# Patient Record
Sex: Female | Born: 1995 | Race: White | Hispanic: No | Marital: Single
Health system: Midwestern US, Academic
[De-identification: ages and names within clinical notes are randomized; demographics above are authoritative.]

---

## 2018-12-12 ENCOUNTER — Encounter: Admit: 2018-12-12 | Discharge: 2018-12-12

## 2018-12-12 DIAGNOSIS — S3720XA Unspecified injury of bladder, initial encounter: ICD-10-CM

## 2018-12-12 LAB — ALCOHOL LEVEL: Lab: 99 mg/dL — ABNORMAL LOW (ref 8.5–10.6)

## 2018-12-12 LAB — PROTIME INR (PT): Lab: 1.1 M/UL (ref 0.8–1.2)

## 2018-12-12 LAB — BETA-HCG: Lab: <1 U/L (ref <5)

## 2018-12-12 LAB — CBC
Lab: 15 10*3/uL — ABNORMAL HIGH (ref 4.5–11.0)
Lab: 17 10*3/uL — ABNORMAL HIGH (ref 4.5–11.0)

## 2018-12-12 LAB — BASIC METABOLIC PANEL
Lab: 106 mg/dL — ABNORMAL HIGH (ref 70–100)
Lab: 116 MMOL/L — ABNORMAL HIGH (ref 98–110)
Lab: 145 MMOL/L (ref 137–147)
Lab: 22 MMOL/L (ref 21–30)
Lab: 3.9 MMOL/L (ref 3.5–5.1)
Lab: 7 % (ref 3–12)
Lab: >60 mL/min (ref >60)
Lab: 0.6 mg/dL — ABNORMAL LOW (ref 0.4–1.00)
Lab: 139 mg/dL — ABNORMAL HIGH (ref 70–100)
Lab: 141 MMOL/L (ref 137–147)
Lab: 4 mg/dL — ABNORMAL LOW (ref 7–25)
Lab: 7.6 mg/dL — ABNORMAL LOW (ref 8.5–10.6)
Lab: >60 mL/min (ref >60)
Lab: >60 mL/min (ref >60)

## 2018-12-12 LAB — IONIZED CALCIUM: Lab: 0.8 MMOL/L — ABNORMAL LOW (ref 1.0–1.3)

## 2018-12-12 LAB — PREGNANCY TEST-URINE
Lab: 1
Lab: NEGATIVE

## 2018-12-12 LAB — MAGNESIUM: Lab: 1.7 mg/dL (ref 1.6–2.6)

## 2018-12-12 LAB — PHOSPHORUS: Lab: 3.9 mg/dL (ref 2.0–4.5)

## 2018-12-12 LAB — CREATINE KINASE-CPK: Lab: 281 U/L — ABNORMAL HIGH (ref 21–215)

## 2018-12-12 LAB — LACTIC ACID (BG - RAPID LACTATE): Lab: 1.9 MMOL/L — ABNORMAL LOW (ref 0.5–2.0)

## 2018-12-12 LAB — PTT (APTT): Lab: 26 s (ref 24.0–36.5)

## 2018-12-12 LAB — LACTIC ACID(LACTATE): Lab: 1.6 MMOL/L (ref 0.5–2.0)

## 2018-12-12 MED ORDER — BISACODYL 10 MG RE SUPP
20 mg | Freq: Every day | RECTAL | 0 refills | Status: DC | PRN
Start: 2018-12-12 — End: 2018-12-14

## 2018-12-12 MED ORDER — FAMOTIDINE (PF) 20 MG/2 ML IV SOLN
20 mg | Freq: Two times a day (BID) | INTRAVENOUS | 0 refills | Status: DC
Start: 2018-12-12 — End: 2018-12-12

## 2018-12-12 MED ORDER — PATCH DOCUMENTATION - SCOPOLAMINE BASE 1 MG/72HR
1 | Freq: Two times a day (BID) | TRANSDERMAL | 0 refills | Status: DC
Start: 2018-12-12 — End: 2018-12-13

## 2018-12-12 MED ORDER — MONTELUKAST 10 MG PO TAB
10 mg | Freq: Every evening | ORAL | 0 refills | Status: DC
Start: 2018-12-12 — End: 2018-12-20
  Administered 2018-12-13 – 2018-12-20 (×8): 10 mg via ORAL

## 2018-12-12 MED ORDER — ONDANSETRON HCL (PF) 4 MG/2 ML IJ SOLN
4-8 mg | INTRAVENOUS | 0 refills | Status: DC | PRN
Start: 2018-12-12 — End: 2018-12-13

## 2018-12-12 MED ORDER — ONDANSETRON HCL (PF) 4 MG/2 ML IJ SOLN
INTRAVENOUS | 0 refills | Status: DC
Start: 2018-12-12 — End: 2018-12-12
  Administered 2018-12-12: 13:00:00 4 mg via INTRAVENOUS

## 2018-12-12 MED ORDER — PHENYLEPHRINE IV DRIP (STD CONC)
0 refills | Status: DC
Start: 2018-12-12 — End: 2018-12-12
  Administered 2018-12-12 (×2): .3 ug/kg/min via INTRAVENOUS

## 2018-12-12 MED ORDER — ALBUTEROL SULFATE 2.5 MG/0.5 ML IN NEBU
2.5 mg | RESPIRATORY_TRACT | 0 refills | Status: DC | PRN
Start: 2018-12-12 — End: 2018-12-12

## 2018-12-12 MED ORDER — OXYCODONE 5 MG PO TAB
5-10 mg | ORAL | 0 refills | Status: DC | PRN
Start: 2018-12-12 — End: 2018-12-13
  Administered 2018-12-12 – 2018-12-13 (×3): 5 mg via ORAL

## 2018-12-12 MED ORDER — POLYETHYLENE GLYCOL 3350 17 GRAM PO PWPK
1 | Freq: Every day | ORAL | 0 refills | Status: DC
Start: 2018-12-12 — End: 2018-12-17
  Administered 2018-12-13 – 2018-12-17 (×5): 17 g via ORAL

## 2018-12-12 MED ORDER — FENTANYL CITRATE (PF) 50 MCG/ML IJ SOLN
0 refills | Status: DC
Start: 2018-12-12 — End: 2018-12-12
  Administered 2018-12-12 (×2): 50 ug via INTRAVENOUS

## 2018-12-12 MED ORDER — FENTANYL CITRATE (PF) 50 MCG/ML IJ SOLN
25 ug | Freq: Once | INTRAVENOUS | 0 refills | Status: CP
Start: 2018-12-12 — End: ?
  Administered 2018-12-12: 12:00:00 25 ug via INTRAVENOUS

## 2018-12-12 MED ORDER — SODIUM CHLORIDE 0.9 % IJ SOLN
50 mL | Freq: Once | INTRAVENOUS | 0 refills | Status: CP
Start: 2018-12-12 — End: ?
  Administered 2018-12-12: 11:00:00 50 mL via INTRAVENOUS

## 2018-12-12 MED ORDER — DEXAMETHASONE SODIUM PHOSPHATE 4 MG/ML IJ SOLN
INTRAVENOUS | 0 refills | Status: DC
Start: 2018-12-12 — End: 2018-12-12
  Administered 2018-12-12: 13:00:00 4 mg via INTRAVENOUS

## 2018-12-12 MED ORDER — DEXTRAN 70-HYPROMELLOSE (PF) 0.1-0.3 % OP DPET
0 refills | Status: DC
Start: 2018-12-12 — End: 2018-12-12
  Administered 2018-12-12: 12:00:00 2 [drp] via OPHTHALMIC

## 2018-12-12 MED ORDER — MAGNESIUM SULFATE IN D5W 1 GRAM/100 ML IV PGBK
1 g | INTRAVENOUS | 0 refills | Status: CP
Start: 2018-12-12 — End: ?
  Administered 2018-12-12 (×3): 1 g via INTRAVENOUS

## 2018-12-12 MED ORDER — FENTANYL PCA 550 MCG/55 ML SYR (STD CONC)(ADULT)(PREMADE)
INTRAVENOUS | 0 refills | Status: DC
Start: 2018-12-12 — End: 2018-12-13
  Administered 2018-12-12: 15:00:00 55.000 mL via INTRAVENOUS

## 2018-12-12 MED ORDER — CEFOXITIN 1 GRAM IV SOLR
0 refills | Status: DC
Start: 2018-12-12 — End: 2018-12-12
  Administered 2018-12-12: 12:00:00 2 g via INTRAVENOUS

## 2018-12-12 MED ORDER — ELECTROLYTE-A IV SOLP
0 refills | Status: DC
Start: 2018-12-12 — End: 2018-12-12
  Administered 2018-12-12 (×2): via INTRAVENOUS

## 2018-12-12 MED ORDER — FENTANYL CITRATE (PF) 50 MCG/ML IJ SOLN
25-50 ug | INTRAVENOUS | 0 refills | Status: DC | PRN
Start: 2018-12-12 — End: 2018-12-12
  Administered 2018-12-12: 11:00:00 50 ug via INTRAVENOUS

## 2018-12-12 MED ORDER — MAGNESIUM OXIDE 400 MG (241.3 MG MAGNESIUM) PO TAB
800 mg | Freq: Once | ORAL | 0 refills | Status: DC
Start: 2018-12-12 — End: 2018-12-12

## 2018-12-12 MED ORDER — KETAMINE 10 MG/ML IJ SOLN
0 refills | Status: DC
Start: 2018-12-12 — End: 2018-12-12
  Administered 2018-12-12: 13:00:00 30 mg via INTRAVENOUS

## 2018-12-12 MED ORDER — HYDROMORPHONE (PF) 2 MG/ML IJ SYRG
0 refills | Status: DC
Start: 2018-12-12 — End: 2018-12-12
  Administered 2018-12-12: 15:00:00 .5 mg via INTRAVENOUS
  Administered 2018-12-12 (×3): 0.5 mg via INTRAVENOUS

## 2018-12-12 MED ORDER — SUCCINYLCHOLINE CHLORIDE 20 MG/ML IJ SOLN
INTRAVENOUS | 0 refills | Status: DC
Start: 2018-12-12 — End: 2018-12-12
  Administered 2018-12-12: 12:00:00 120 mg via INTRAVENOUS

## 2018-12-12 MED ORDER — IOHEXOL 300 MG IODINE/ML IV SOLN
25 mL | Freq: Once | 0 refills | Status: CP
Start: 2018-12-12 — End: ?
  Administered 2018-12-12: 13:00:00 25 mL

## 2018-12-12 MED ORDER — ALBUTEROL SULFATE 90 MCG/ACTUATION IN HFAA
2 | RESPIRATORY_TRACT | 0 refills | Status: DC | PRN
Start: 2018-12-12 — End: 2018-12-20

## 2018-12-12 MED ORDER — PROPOFOL INJ 10 MG/ML IV VIAL
0 refills | Status: DC
Start: 2018-12-12 — End: 2018-12-12
  Administered 2018-12-12: 12:00:00 130 mg via INTRAVENOUS

## 2018-12-12 MED ORDER — ONDANSETRON HCL 4 MG PO TAB
4 mg | ORAL | 0 refills | Status: DC | PRN
Start: 2018-12-12 — End: 2018-12-20
  Administered 2018-12-13 – 2018-12-17 (×5): 4 mg via ORAL

## 2018-12-12 MED ORDER — NALOXONE 0.4 MG/ML IJ SOLN
.08 mg | INTRAVENOUS | 0 refills | Status: DC | PRN
Start: 2018-12-12 — End: 2018-12-14

## 2018-12-12 MED ORDER — SENNOSIDES 8.6 MG PO TAB
1 | Freq: Two times a day (BID) | ORAL | 0 refills | Status: DC
Start: 2018-12-12 — End: 2018-12-17
  Administered 2018-12-13 – 2018-12-17 (×10): 1 via ORAL

## 2018-12-12 MED ORDER — SCOPOLAMINE BASE 1 MG OVER 3 DAYS TD PT3D
1 | TRANSDERMAL | 0 refills | Status: DC
Start: 2018-12-12 — End: 2018-12-13
  Administered 2018-12-12: 21:00:00 1 via TRANSDERMAL

## 2018-12-12 MED ORDER — ROCURONIUM 10 MG/ML IV SOLN
INTRAVENOUS | 0 refills | Status: DC
Start: 2018-12-12 — End: 2018-12-12
  Administered 2018-12-12: 13:00:00 30 mg via INTRAVENOUS

## 2018-12-12 MED ORDER — TRAZODONE 50 MG PO TAB
50 mg | Freq: Every evening | ORAL | 0 refills | Status: DC | PRN
Start: 2018-12-12 — End: 2018-12-14
  Administered 2018-12-13: 04:00:00 50 mg via ORAL

## 2018-12-12 MED ORDER — ACETAMINOPHEN 500 MG PO TAB
1000 mg | ORAL | 0 refills | Status: DC
Start: 2018-12-12 — End: 2018-12-20
  Administered 2018-12-13 – 2018-12-15 (×5): 1000 mg via ORAL
  Administered 2018-12-15: 20:00:00 500 mg via ORAL
  Administered 2018-12-15 – 2018-12-16 (×2): 1000 mg via ORAL
  Administered 2018-12-16: 03:00:00 500 mg via ORAL
  Administered 2018-12-16 – 2018-12-20 (×11): 1000 mg via ORAL

## 2018-12-12 MED ORDER — METHOCARBAMOL 500 MG PO TAB
500 mg | Freq: Two times a day (BID) | ORAL | 0 refills | Status: DC
Start: 2018-12-12 — End: 2018-12-14
  Administered 2018-12-13 – 2018-12-14 (×4): 500 mg via ORAL

## 2018-12-12 MED ORDER — PROCHLORPERAZINE EDISYLATE 5 MG/ML IJ SOLN
10 mg | INTRAVENOUS | 0 refills | Status: DC | PRN
Start: 2018-12-12 — End: 2018-12-13

## 2018-12-12 MED ORDER — IOHEXOL 350 MG IODINE/ML IV SOLN
100 mL | Freq: Once | INTRAVENOUS | 0 refills | Status: CP
Start: 2018-12-12 — End: ?
  Administered 2018-12-12: 11:00:00 100 mL via INTRAVENOUS

## 2018-12-12 MED ORDER — SUGAMMADEX 100 MG/ML IV SOLN
INTRAVENOUS | 0 refills | Status: DC
Start: 2018-12-12 — End: 2018-12-12
  Administered 2018-12-12: 14:00:00 99 mg via INTRAVENOUS

## 2018-12-12 MED ORDER — LIDOCAINE (PF) 200 MG/10 ML (2 %) IJ SYRG
0 refills | Status: DC
Start: 2018-12-12 — End: 2018-12-12
  Administered 2018-12-12: 12:00:00 100 mg via INTRAVENOUS

## 2018-12-12 MED ORDER — ENOXAPARIN 30 MG/0.3 ML SC SYRG
30 mg | Freq: Two times a day (BID) | SUBCUTANEOUS | 0 refills | Status: DC
Start: 2018-12-12 — End: 2018-12-20
  Administered 2018-12-13 – 2018-12-20 (×15): 30 mg via SUBCUTANEOUS

## 2018-12-12 MED ORDER — ONDANSETRON HCL (PF) 4 MG/2 ML IJ SOLN
4 mg | INTRAVENOUS | 0 refills | Status: DC | PRN
Start: 2018-12-12 — End: 2018-12-12
  Administered 2018-12-12 (×3): 4 mg via INTRAVENOUS

## 2018-12-13 ENCOUNTER — Encounter: Admit: 2018-12-13 | Discharge: 2018-12-13

## 2018-12-13 DIAGNOSIS — J45909 Unspecified asthma, uncomplicated: Secondary | ICD-10-CM

## 2018-12-13 DIAGNOSIS — S329XXA Fracture of unspecified parts of lumbosacral spine and pelvis, initial encounter for closed fracture: Secondary | ICD-10-CM

## 2018-12-13 DIAGNOSIS — S3210XA Unspecified fracture of sacrum, initial encounter for closed fracture: Secondary | ICD-10-CM

## 2018-12-13 LAB — MAGNESIUM: Lab: 2 mg/dL — ABNORMAL HIGH (ref 1.6–2.6)

## 2018-12-13 LAB — CBC: Lab: 13 K/UL — ABNORMAL HIGH (ref 4.5–11.0)

## 2018-12-13 LAB — BASIC METABOLIC PANEL: Lab: 137 MMOL/L — ABNORMAL LOW (ref 137–147)

## 2018-12-13 LAB — PHOSPHORUS: Lab: 3 mg/dL — ABNORMAL HIGH (ref 2.0–4.5)

## 2018-12-13 MED ORDER — FENTANYL CITRATE (PF) 50 MCG/ML IJ SOLN
25-50 ug | Freq: Two times a day (BID) | INTRAVENOUS | 0 refills | Status: DC | PRN
Start: 2018-12-13 — End: 2018-12-14

## 2018-12-13 MED ORDER — OXYCODONE 5 MG PO TAB
5-15 mg | ORAL | 0 refills | Status: DC | PRN
Start: 2018-12-13 — End: 2018-12-17
  Administered 2018-12-13: 18:00:00 5 mg via ORAL
  Administered 2018-12-14 – 2018-12-15 (×5): 10 mg via ORAL
  Administered 2018-12-15: 20:00:00 5 mg via ORAL
  Administered 2018-12-15 – 2018-12-16 (×6): 10 mg via ORAL
  Administered 2018-12-17 (×2): 15 mg via ORAL

## 2018-12-14 MED ORDER — BISACODYL 10 MG RE SUPP
10 mg | Freq: Every day | RECTAL | 0 refills | Status: DC
Start: 2018-12-14 — End: 2018-12-17

## 2018-12-14 MED ORDER — TRAZODONE 50 MG PO TAB
50 mg | Freq: Every evening | ORAL | 0 refills | Status: DC
Start: 2018-12-14 — End: 2018-12-20
  Administered 2018-12-15 – 2018-12-20 (×4): 50 mg via ORAL

## 2018-12-14 MED ORDER — CALCIUM CARBONATE 200 MG CALCIUM (500 MG) PO CHEW
500-1000 mg | ORAL | 0 refills | Status: DC | PRN
Start: 2018-12-14 — End: 2018-12-20
  Administered 2018-12-15: 04:00:00 1000 mg via ORAL

## 2018-12-14 MED ORDER — METHOCARBAMOL 500 MG PO TAB
500 mg | ORAL | 0 refills | Status: DC
Start: 2018-12-14 — End: 2018-12-20
  Administered 2018-12-15 – 2018-12-20 (×16): 500 mg via ORAL

## 2018-12-15 MED ORDER — MAGNESIUM HYDROXIDE 2,400 MG/10 ML PO SUSP
10 mL | Freq: Once | ORAL | 0 refills | Status: DC
Start: 2018-12-15 — End: 2018-12-16

## 2018-12-16 MED ORDER — TERCONAZOLE 0.4 % VA CREA
1 | Freq: Every evening | VAGINAL | 0 refills | Status: DC
Start: 2018-12-16 — End: 2018-12-20

## 2018-12-16 MED ORDER — NALOXEGOL 25 MG PO TAB
25 mg | Freq: Once | ORAL | 0 refills | Status: CP
Start: 2018-12-16 — End: ?
  Administered 2018-12-16: 23:00:00 25 mg via ORAL

## 2018-12-16 MED ORDER — TERCONAZOLE 80 MG VA SUPP
80 mg | Freq: Every evening | VAGINAL | 0 refills | Status: DC
Start: 2018-12-16 — End: 2018-12-17

## 2018-12-16 MED ADMIN — WATER FOR IRRIGATION, STERILE IR SOLN [7485]: INTRAVESICAL | @ 15:00:00 | Stop: 2018-12-16 | NDC 00338000404

## 2018-12-17 LAB — BASIC METABOLIC PANEL
Lab: 0.5 mg/dL (ref 0.4–1.00)
Lab: 100 mg/dL (ref 70–100)
Lab: 12 pg (ref 3–12)
Lab: 128 MMOL/L — ABNORMAL LOW (ref 137–147)
Lab: 13 mg/dL (ref 7–25)
Lab: 24 MMOL/L (ref 21–30)
Lab: 9.2 mg/dL (ref 8.5–10.6)
Lab: >60 mL/min (ref >60)
Lab: >60 mL/min (ref >60)

## 2018-12-17 LAB — CBC: Lab: 12 10*3/uL — ABNORMAL HIGH (ref 4.5–11.0)

## 2018-12-17 LAB — MAGNESIUM: Lab: 1.6 mg/dL — ABNORMAL LOW (ref 1.6–2.6)

## 2018-12-17 LAB — PHOSPHORUS: Lab: 3.2 mg/dL — ABNORMAL LOW (ref 2.0–4.5)

## 2018-12-17 MED ORDER — SODIUM CHLORIDE 0.9 % IV SOLP
1000 mL | INTRAVENOUS | 0 refills | Status: DC
Start: 2018-12-17 — End: 2018-12-19
  Administered 2018-12-17 – 2018-12-19 (×5): 1000 mL via INTRAVENOUS

## 2018-12-17 MED ORDER — SENNOSIDES 8.6 MG PO TAB
2 | Freq: Two times a day (BID) | ORAL | 0 refills | Status: DC
Start: 2018-12-17 — End: 2018-12-20
  Administered 2018-12-18 (×2): 2 via ORAL

## 2018-12-17 MED ORDER — POLYETHYLENE GLYCOL 3350 17 GRAM PO PWPK
2 | Freq: Every day | ORAL | 0 refills | Status: DC
Start: 2018-12-17 — End: 2018-12-20
  Administered 2018-12-18: 16:00:00 34 g via ORAL

## 2018-12-17 MED ORDER — BISACODYL 10 MG RE SUPP
10 mg | Freq: Two times a day (BID) | RECTAL | 0 refills | Status: DC
Start: 2018-12-17 — End: 2018-12-20

## 2018-12-17 MED ORDER — POTASSIUM CHLORIDE 20 MEQ PO TBTQ
80 meq | Freq: Once | ORAL | 0 refills | Status: CP
Start: 2018-12-17 — End: ?
  Administered 2018-12-17: 19:00:00 80 meq via ORAL

## 2018-12-17 MED ORDER — OXYCODONE 5 MG PO TAB
5-15 mg | ORAL | 0 refills | Status: DC | PRN
Start: 2018-12-17 — End: 2018-12-18
  Administered 2018-12-17: 19:00:00 10 mg via ORAL
  Administered 2018-12-18: 08:00:00 5 mg via ORAL
  Administered 2018-12-18: 03:00:00 10 mg via ORAL

## 2018-12-17 MED ORDER — MAGNESIUM SULFATE IN D5W 1 GRAM/100 ML IV PGBK
1 g | INTRAVENOUS | 0 refills | Status: CP
Start: 2018-12-17 — End: ?
  Administered 2018-12-17 (×2): 1 g via INTRAVENOUS

## 2018-12-18 ENCOUNTER — Encounter: Admit: 2018-12-18 | Discharge: 2018-12-18

## 2018-12-18 DIAGNOSIS — S329XXA Fracture of unspecified parts of lumbosacral spine and pelvis, initial encounter for closed fracture: Secondary | ICD-10-CM

## 2018-12-18 DIAGNOSIS — J45909 Unspecified asthma, uncomplicated: Secondary | ICD-10-CM

## 2018-12-18 DIAGNOSIS — S3210XA Unspecified fracture of sacrum, initial encounter for closed fracture: Secondary | ICD-10-CM

## 2018-12-18 LAB — BASIC METABOLIC PANEL
Lab: 133 MMOL/L — ABNORMAL LOW (ref <100)
Lab: 0.4 mg/dL (ref 0.4–1.00)
Lab: 134 MMOL/L — ABNORMAL LOW (ref 137–147)
Lab: 24 MMOL/L (ref 21–30)
Lab: 6 mg/dL — ABNORMAL LOW (ref 7–25)
Lab: 8.1 mg/dL — ABNORMAL LOW (ref 8.5–10.6)
Lab: 98 mg/dL (ref 70–100)
Lab: >60 mL/min (ref >60)
Lab: >60 mL/min (ref >60)
Lab: 9 (ref 3–12)

## 2018-12-18 LAB — MAGNESIUM
Lab: 1.7 mg/dL — ABNORMAL LOW (ref 1.6–2.6)
Lab: 1.8 mg/dL (ref 1.6–2.6)

## 2018-12-18 LAB — CBC: Lab: 10 K/UL — ABNORMAL HIGH (ref <100)

## 2018-12-18 LAB — PHOSPHORUS
Lab: 1.9 mg/dL — ABNORMAL LOW (ref >60)
Lab: 2.5 mg/dL (ref 2.0–4.5)

## 2018-12-18 MED ORDER — POTASSIUM PHOSPHATE IVPB-HIGH
20 MMOL | Freq: Once | INTRAVENOUS | 0 refills | Status: CP
Start: 2018-12-18 — End: ?
  Administered 2018-12-18 (×2): 20 mmol via INTRAVENOUS

## 2018-12-18 MED ORDER — POTASSIUM CHLORIDE IN WATER 10 MEQ/50 ML IV PGBK
10 meq | INTRAVENOUS | 0 refills | Status: CP
Start: 2018-12-18 — End: ?
  Administered 2018-12-18 (×3): 10 meq via INTRAVENOUS

## 2018-12-18 MED ORDER — OXYCODONE 5 MG PO TAB
5-10 mg | ORAL | 0 refills | Status: DC | PRN
Start: 2018-12-18 — End: 2018-12-20
  Administered 2018-12-18 – 2018-12-19 (×5): 10 mg via ORAL
  Administered 2018-12-20 (×2): 5 mg via ORAL
  Administered 2018-12-20: 14:00:00 10 mg via ORAL

## 2018-12-18 MED ORDER — MAGNESIUM CITRATE PO SOLN
296 mL | Freq: Once | ORAL | 0 refills | Status: CP
Start: 2018-12-18 — End: ?
  Administered 2018-12-18: 16:00:00 296 mL via ORAL

## 2018-12-19 LAB — PHOSPHORUS: Lab: 2.3 mg/dL (ref 2.0–4.5)

## 2018-12-19 LAB — BASIC METABOLIC PANEL: Lab: 137 MMOL/L — ABNORMAL LOW (ref >60)

## 2018-12-19 LAB — CBC: Lab: 8.2 K/UL — ABNORMAL LOW (ref 4.5–11.0)

## 2018-12-19 LAB — MAGNESIUM: Lab: 1.8 mg/dL — ABNORMAL HIGH (ref 1.6–2.6)

## 2018-12-19 MED ORDER — POTASSIUM PHOSPHATE, MONOBASIC 500 MG PO TBSO
2 | Freq: Once | ORAL | 0 refills | Status: CP
Start: 2018-12-19 — End: ?
  Administered 2018-12-19: 16:00:00 2 via ORAL

## 2018-12-19 MED ORDER — MAGNESIUM SULFATE IN D5W 1 GRAM/100 ML IV PGBK
1 g | INTRAVENOUS | 0 refills | Status: CP
Start: 2018-12-19 — End: ?
  Administered 2018-12-19 (×2): 1 g via INTRAVENOUS

## 2018-12-19 MED ORDER — IMS MIXTURE TEMPLATE
30 meq | Freq: Two times a day (BID) | ORAL | 0 refills | Status: CP
Start: 2018-12-19 — End: ?
  Administered 2018-12-19 (×4): 30 meq via ORAL

## 2018-12-20 ENCOUNTER — Emergency Department: Admit: 2018-12-12 | Discharge: 2018-12-13

## 2018-12-20 ENCOUNTER — Inpatient Hospital Stay: Admit: 2018-12-18 | Discharge: 2018-12-18

## 2018-12-20 ENCOUNTER — Encounter: Admit: 2018-12-20 | Discharge: 2018-12-20

## 2018-12-20 ENCOUNTER — Ambulatory Visit
Admit: 2018-12-12 | Discharge: 2018-12-20 | Disposition: A | Discharge status: Home Health | Payer: Commercial Managed Care - HMO | Source: Other Acute Inpatient Hospital

## 2018-12-20 ENCOUNTER — Inpatient Hospital Stay: Admit: 2018-12-12 | Discharge: 2018-12-12

## 2018-12-20 ENCOUNTER — Emergency Department: Admit: 2018-12-12 | Discharge: 2018-12-12

## 2018-12-20 DIAGNOSIS — S32810A Multiple fractures of pelvis with stable disruption of pelvic ring, initial encounter for closed fracture: Secondary | ICD-10-CM

## 2018-12-20 DIAGNOSIS — G8911 Acute pain due to trauma: Secondary | ICD-10-CM

## 2018-12-20 DIAGNOSIS — F17210 Nicotine dependence, cigarettes, uncomplicated: Secondary | ICD-10-CM

## 2018-12-20 DIAGNOSIS — S32301A Unspecified fracture of right ilium, initial encounter for closed fracture: Secondary | ICD-10-CM

## 2018-12-20 DIAGNOSIS — K5903 Drug induced constipation: Secondary | ICD-10-CM

## 2018-12-20 DIAGNOSIS — S3762XA Contusion of uterus, initial encounter: Secondary | ICD-10-CM

## 2018-12-20 DIAGNOSIS — N3289 Other specified disorders of bladder: Secondary | ICD-10-CM

## 2018-12-20 DIAGNOSIS — R310 Gross hematuria: Secondary | ICD-10-CM

## 2018-12-20 DIAGNOSIS — E876 Hypokalemia: Secondary | ICD-10-CM

## 2018-12-20 DIAGNOSIS — S3729XA Other injury of bladder, initial encounter: Secondary | ICD-10-CM

## 2018-12-20 DIAGNOSIS — S3210XA Unspecified fracture of sacrum, initial encounter for closed fracture: Secondary | ICD-10-CM

## 2018-12-20 DIAGNOSIS — J45909 Unspecified asthma, uncomplicated: Secondary | ICD-10-CM

## 2018-12-20 DIAGNOSIS — D62 Acute posthemorrhagic anemia: Secondary | ICD-10-CM

## 2018-12-20 DIAGNOSIS — F10129 Alcohol abuse with intoxication, unspecified: Secondary | ICD-10-CM

## 2018-12-20 DIAGNOSIS — Z7409 Other reduced mobility: Secondary | ICD-10-CM

## 2018-12-20 LAB — PHOSPHORUS: Lab: 2.5 mg/dL (ref 2.0–4.5)

## 2018-12-20 LAB — CBC: Lab: 4.7 K/UL — ABNORMAL LOW (ref >60)

## 2018-12-20 LAB — BASIC METABOLIC PANEL: Lab: 138 MMOL/L — ABNORMAL HIGH (ref 137–147)

## 2018-12-20 LAB — MAGNESIUM: Lab: 1.9 mg/dL — ABNORMAL LOW (ref 1.6–2.6)

## 2018-12-20 MED ORDER — SENNOSIDES 8.6 MG PO TAB
2 | ORAL_TABLET | Freq: Two times a day (BID) | ORAL | 0 refills | Status: AC
Start: 2018-12-20 — End: ?

## 2018-12-20 MED ORDER — POTASSIUM PHOSPHATE, MONOBASIC 500 MG PO TBSO
2 | Freq: Once | ORAL | 0 refills | Status: CP
Start: 2018-12-20 — End: ?
  Administered 2018-12-20: 16:00:00 2 via ORAL

## 2018-12-20 MED ORDER — ONDANSETRON HCL 4 MG PO TAB
4 mg | ORAL_TABLET | ORAL | 0 refills | 8.00000 days | Status: AC | PRN
Start: 2018-12-20 — End: 2018-12-20

## 2018-12-20 MED ORDER — METHOCARBAMOL 500 MG PO TAB
500 mg | ORAL_TABLET | Freq: Three times a day (TID) | ORAL | 0 refills | Status: AC | PRN
Start: 2018-12-20 — End: 2018-12-20
  Filled 2018-12-20 (×2): qty 30, 10d supply, fill #1

## 2018-12-20 MED ORDER — POLYETHYLENE GLYCOL 3350 17 GRAM PO PWPK
17 g | Freq: Every day | ORAL | 0 refills | 22.00000 days | Status: AC
Start: 2018-12-20 — End: ?

## 2018-12-20 MED ORDER — OXYCODONE 5 MG PO TAB
5-10 mg | ORAL_TABLET | ORAL | 0 refills | 6.00000 days | Status: AC | PRN
Start: 2018-12-20 — End: ?

## 2018-12-20 MED ORDER — PHENAZOPYRIDINE 200 MG PO TAB
200 mg | Freq: Three times a day (TID) | ORAL | 0 refills | Status: DC
Start: 2018-12-20 — End: 2018-12-20
  Administered 2018-12-20: 14:00:00 200 mg via ORAL

## 2018-12-20 MED ORDER — OXYCODONE 5 MG PO TAB
5-10 mg | ORAL_TABLET | ORAL | 0 refills | 6.00000 days | Status: AC | PRN
Start: 2018-12-20 — End: 2018-12-20
  Filled 2018-12-20 (×2): qty 45, 4d supply, fill #1

## 2018-12-20 MED ORDER — METHOCARBAMOL 500 MG PO TAB
500 mg | ORAL_TABLET | Freq: Three times a day (TID) | ORAL | 0 refills | Status: AC | PRN
Start: 2018-12-20 — End: ?

## 2018-12-20 MED ORDER — CALCIUM CARBONATE 200 MG CALCIUM (500 MG) PO CHEW
500-1000 mg | ORAL | 0 refills | 33.00000 days | Status: AC | PRN
Start: 2018-12-20 — End: ?

## 2018-12-20 MED ORDER — ACETAMINOPHEN 500 MG PO TAB
1000 mg | ORAL | 0 refills | Status: AC
Start: 2018-12-20 — End: ?

## 2018-12-20 MED ORDER — PHENAZOPYRIDINE 200 MG PO TAB
200 mg | ORAL_TABLET | Freq: Three times a day (TID) | ORAL | 0 refills | Status: AC
Start: 2018-12-20 — End: ?

## 2018-12-20 MED ORDER — ONDANSETRON HCL 4 MG PO TAB
4 mg | ORAL_TABLET | ORAL | 0 refills | 8.00000 days | Status: AC | PRN
Start: 2018-12-20 — End: ?
  Filled 2018-12-20 (×2): qty 15, 4d supply, fill #1

## 2018-12-20 MED ORDER — PHENAZOPYRIDINE 200 MG PO TAB
200 mg | ORAL_TABLET | Freq: Three times a day (TID) | ORAL | 0 refills | Status: AC
Start: 2018-12-20 — End: 2018-12-20

## 2018-12-20 MED ORDER — POTASSIUM CHLORIDE 20 MEQ PO TBTQ
40 meq | Freq: Once | ORAL | 0 refills | Status: CP
Start: 2018-12-20 — End: ?
  Administered 2018-12-20: 14:00:00 40 meq via ORAL

## 2018-12-20 MED ORDER — POLYETHYLENE GLYCOL 3350 17 GRAM PO PWPK
1 | Freq: Every day | ORAL | 0 refills | Status: DC
Start: 2018-12-20 — End: 2018-12-20

## 2018-12-20 MED ORDER — ENOXAPARIN 40 MG/0.4 ML SC SYRG
40 mg | Freq: Every day | SUBCUTANEOUS | 0 refills | 7.00000 days | Status: AC
Start: 2018-12-20 — End: 2019-01-03
  Filled 2018-12-20 (×2): qty 35, 14d supply, fill #1

## 2018-12-21 ENCOUNTER — Encounter: Admit: 2018-12-21 | Discharge: 2018-12-21

## 2018-12-24 ENCOUNTER — Encounter: Admit: 2018-12-24 | Discharge: 2018-12-24

## 2019-01-01 ENCOUNTER — Encounter: Admit: 2019-01-01 | Discharge: 2019-01-01

## 2019-01-01 DIAGNOSIS — S32591A Other specified fracture of right pubis, initial encounter for closed fracture: ICD-10-CM

## 2019-01-01 DIAGNOSIS — S32301D Unspecified fracture of right ilium, subsequent encounter for fracture with routine healing: Principal | ICD-10-CM

## 2019-01-03 ENCOUNTER — Encounter: Admit: 2019-01-03 | Discharge: 2019-01-03

## 2019-01-03 ENCOUNTER — Ambulatory Visit: Admit: 2019-01-03 | Discharge: 2019-01-03

## 2019-01-03 ENCOUNTER — Ambulatory Visit: Admit: 2019-01-03 | Discharge: 2019-01-03 | Payer: Commercial Managed Care - HMO

## 2019-01-03 DIAGNOSIS — S32591A Other specified fracture of right pubis, initial encounter for closed fracture: ICD-10-CM

## 2019-01-03 DIAGNOSIS — S32301D Unspecified fracture of right ilium, subsequent encounter for fracture with routine healing: Principal | ICD-10-CM

## 2019-01-03 DIAGNOSIS — S3210XA Unspecified fracture of sacrum, initial encounter for closed fracture: ICD-10-CM

## 2019-01-03 DIAGNOSIS — J45909 Unspecified asthma, uncomplicated: Principal | ICD-10-CM

## 2019-01-03 DIAGNOSIS — S32810D Multiple fractures of pelvis with stable disruption of pelvic ring, subsequent encounter for fracture with routine healing: ICD-10-CM

## 2019-01-03 DIAGNOSIS — S329XXA Fracture of unspecified parts of lumbosacral spine and pelvis, initial encounter for closed fracture: ICD-10-CM

## 2019-01-03 MED ORDER — RIVAROXABAN 10 MG PO TAB
10 mg | ORAL_TABLET | Freq: Every day | ORAL | 1 refills | 30.00000 days | Status: AC
Start: 2019-01-03 — End: ?
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
## 2019-01-07 ENCOUNTER — Encounter: Admit: 2019-01-07 | Discharge: 2019-01-07

## 2019-01-07 NOTE — Telephone Encounter
Rhonda RN w/ [facility] called stating pt wants to switch to ASA b/c w/ Xarelto reporting still cold all the time, family wants an alternative, also clarifying WB status [phone]).    Called rhonda and advised we recommend pt stay on Xarelto until at least 2/20, recommend duration of NWB but AT LEAST until 2/20. She states she will re-iterate this to pt. Confirmed NWB RLE, WBAT LLE. She had no further questions/concerns at this time.

## 2019-02-22 ENCOUNTER — Encounter: Admit: 2019-02-22 | Discharge: 2019-02-22

## 2019-02-22 ENCOUNTER — Encounter: Admit: 2019-02-22 | Discharge: 2019-02-23

## 2019-02-22 DIAGNOSIS — S32810D Multiple fractures of pelvis with stable disruption of pelvic ring, subsequent encounter for fracture with routine healing: Principal | ICD-10-CM

## 2019-02-26 ENCOUNTER — Encounter: Admit: 2019-02-26 | Discharge: 2019-02-26

## 2019-02-26 ENCOUNTER — Ambulatory Visit: Admit: 2019-02-26 | Discharge: 2019-02-27 | Payer: Commercial Managed Care - HMO

## 2019-02-26 DIAGNOSIS — S32810D Multiple fractures of pelvis with stable disruption of pelvic ring, subsequent encounter for fracture with routine healing: Principal | ICD-10-CM
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
## 2023-06-07 ENCOUNTER — Encounter: Admit: 2023-06-07 | Discharge: 2023-06-07
